# Patient Record
Sex: Male | Born: 1985
Health system: Southern US, Community
[De-identification: ages and names within clinical notes are randomized; demographics above are authoritative.]

---
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
## 2008-12-23 ENCOUNTER — Emergency Department (HOSPITAL_COMMUNITY): Admission: EM | Admit: 2008-12-23 | Discharge: 2008-12-23 | Payer: Self-pay | Admitting: Emergency Medicine

## 2014-11-17 DIAGNOSIS — H1010 Acute atopic conjunctivitis, unspecified eye: Secondary | ICD-10-CM | POA: Insufficient documentation

## 2014-11-17 DIAGNOSIS — J309 Allergic rhinitis, unspecified: Principal | ICD-10-CM

## 2014-12-11 ENCOUNTER — Ambulatory Visit (INDEPENDENT_AMBULATORY_CARE_PROVIDER_SITE_OTHER): Payer: 59

## 2014-12-11 DIAGNOSIS — H1010 Acute atopic conjunctivitis, unspecified eye: Secondary | ICD-10-CM

## 2014-12-11 DIAGNOSIS — J309 Allergic rhinitis, unspecified: Secondary | ICD-10-CM

## 2015-01-09 ENCOUNTER — Ambulatory Visit (INDEPENDENT_AMBULATORY_CARE_PROVIDER_SITE_OTHER): Payer: 59

## 2015-01-09 DIAGNOSIS — J309 Allergic rhinitis, unspecified: Secondary | ICD-10-CM | POA: Diagnosis not present

## 2015-02-03 ENCOUNTER — Ambulatory Visit (INDEPENDENT_AMBULATORY_CARE_PROVIDER_SITE_OTHER): Payer: 59 | Admitting: *Deleted

## 2015-02-03 DIAGNOSIS — J309 Allergic rhinitis, unspecified: Secondary | ICD-10-CM

## 2015-02-12 ENCOUNTER — Ambulatory Visit (INDEPENDENT_AMBULATORY_CARE_PROVIDER_SITE_OTHER): Payer: 59

## 2015-02-12 DIAGNOSIS — J309 Allergic rhinitis, unspecified: Secondary | ICD-10-CM | POA: Diagnosis not present

## 2015-02-24 ENCOUNTER — Ambulatory Visit (INDEPENDENT_AMBULATORY_CARE_PROVIDER_SITE_OTHER): Payer: 59

## 2015-02-24 DIAGNOSIS — J309 Allergic rhinitis, unspecified: Secondary | ICD-10-CM

## 2015-03-04 ENCOUNTER — Ambulatory Visit (INDEPENDENT_AMBULATORY_CARE_PROVIDER_SITE_OTHER): Payer: 59

## 2015-03-04 DIAGNOSIS — J309 Allergic rhinitis, unspecified: Secondary | ICD-10-CM | POA: Diagnosis not present

## 2015-03-12 ENCOUNTER — Ambulatory Visit (INDEPENDENT_AMBULATORY_CARE_PROVIDER_SITE_OTHER): Payer: 59

## 2015-03-12 DIAGNOSIS — J309 Allergic rhinitis, unspecified: Secondary | ICD-10-CM

## 2015-03-15 HISTORY — PX: KNEE SURGERY: SHX244

## 2015-04-06 ENCOUNTER — Ambulatory Visit (INDEPENDENT_AMBULATORY_CARE_PROVIDER_SITE_OTHER): Payer: 59 | Admitting: *Deleted

## 2015-04-06 DIAGNOSIS — J309 Allergic rhinitis, unspecified: Secondary | ICD-10-CM

## 2015-04-24 ENCOUNTER — Ambulatory Visit (INDEPENDENT_AMBULATORY_CARE_PROVIDER_SITE_OTHER): Payer: 59 | Admitting: Neurology

## 2015-04-24 DIAGNOSIS — J309 Allergic rhinitis, unspecified: Secondary | ICD-10-CM | POA: Diagnosis not present

## 2015-05-03 ENCOUNTER — Ambulatory Visit (INDEPENDENT_AMBULATORY_CARE_PROVIDER_SITE_OTHER): Payer: 59

## 2015-05-03 ENCOUNTER — Ambulatory Visit (INDEPENDENT_AMBULATORY_CARE_PROVIDER_SITE_OTHER): Payer: 59 | Admitting: Emergency Medicine

## 2015-05-03 VITALS — BP 112/72 | HR 73 | Temp 98.2°F | Resp 12 | Ht 77.0 in | Wt 330.0 lb

## 2015-05-03 DIAGNOSIS — M25561 Pain in right knee: Secondary | ICD-10-CM

## 2015-05-03 DIAGNOSIS — S83001A Unspecified subluxation of right patella, initial encounter: Secondary | ICD-10-CM

## 2015-05-03 DIAGNOSIS — S83003A Unspecified subluxation of unspecified patella, initial encounter: Secondary | ICD-10-CM | POA: Insufficient documentation

## 2015-05-03 MED ORDER — HYDROCODONE-ACETAMINOPHEN 5-325 MG PO TABS: 1.0000 | ORAL_TABLET | Freq: Four times a day (QID) | ORAL | Status: DC | PRN

## 2015-05-03 NOTE — Progress Notes (Addendum)
Chief Complaint:  Chief Complaint  Patient presents with  . Knee Injury    right knee dislocated today while sitting in church    HPI: Jason Hanson is a 30 y.o. male who is here for recurrent episodes of lsubuxation of the right knee . The kneecap has been slipping in and out today. It does not feel like it is completely back in at the present time. He is uncomfortable and has a burning pain down the right side of his leg.  No past medical history on file. No past surgical history on file. Social History   Social History  . Marital Status: Single    Spouse Name: N/A  . Number of Children: N/A  . Years of Education: N/A   Social History Main Topics  . Smoking status: Never Smoker   . Smokeless tobacco: None  . Alcohol Use: No  . Drug Use: No  . Sexual Activity: Not Asked   Other Topics Concern  . None   Social History Narrative  . None   No family history on file. Allergies  Allergen Reactions  . Sulfa Antibiotics Rash   Prior to Admission medications   Medication Sig Start Date End Date Taking? Authorizing Provider  cetirizine (ZYRTEC ALLERGY) 10 MG tablet Take 10 mg by mouth daily.   Yes Historical Provider, MD  lisinopril (PRINIVIL) 20 MG tablet Take 20 mg by mouth daily.   Yes Historical Provider, MD  montelukast (SINGULAIR) 10 MG tablet Take 10 mg by mouth daily.   Yes Historical Provider, MD  Multiple Vitamins-Minerals (MULTIVITAMIN ADULT) TABS Take 1 tablet by mouth daily.   Yes Historical Provider, MD  Azelastine-Fluticasone (DYMISTA) 137-50 MCG/ACT SUSP Place 2 sprays into the nose daily. Reported on 05/03/2015    Historical Provider, MD     ROS: The patient denies fevers, chills, night sweats, unintentional weight loss, chest pain, palpitations, wheezing, dyspnea on exertion, nausea, vomiting, abdominal pain, dysuria, hematuria, melena, numbness, weakness, or tingling.   All other systems have been reviewed and were otherwise negative with the  exception of those mentioned in the HPI and as above.    PHYSICAL EXAM: Filed Vitals:   05/03/15 1121  BP: 112/72  Pulse: 73  Temp: 98.2 F (36.8 C)  Resp: 12   Filed Vitals:   05/03/15 1121  Height:  (1.956 m)  Weight: 330 lb (149.687 kg)   Body mass index is 39.12 kg/(m^2).   General: Alert, no acute distress HEENT:  Normocephalic, atraumatic, oropharynx patent. EOMI, PERRLA Cardiovascular:  Regular rate and rhythm, no rubs murmurs or gallops.  No Carotid bruits, radial pulse intact. No pedal edema.  Respiratory: Clear to auscultation bilaterally.  No wheezes, rales, or rhonchi.  No cyanosis, no use of accessory musculature GI: No organomegaly, abdomen is soft and non-tender, positive bowel sounds.  No masses. Skin: No rashes. Neurologic: Facial musculature symmetric. Psychiatric: Patient is appropriate throughout our interaction. Lymphatic: No cervical lymphadenopathy Musculoskeletal: Difficult to walk. There is definite popping with flexion extension of the right knee. Pulses are 2+. Reflexes are 2+. There is no swelling about the knee.   LABS: No results found for this or any previous visit.   EKG/XRAY:   Primary read interpreted by Dr. Cleta Alberts at [facility].   ASSESSMENT/PLAN: 1. Knee pain, right  - DG Knee 1-2 Views Right; Future radiologist did not see a fracture.   2. Patellar subluxation, right, initial encounter   Referral made for patient to  see orthopedist. He was given a note to be out of work. 3. Numbness leg Patient may have also traumatized the peroneal nerve. He does have some numbness down the lateral portion of the right lower leg. Gross sideeffects, risk and benefits, and alternatives of medications d/w patient. Patient is aware that all medications have potential sideeffects and we are unable to predict every sideeffect or drug-drug interaction that may occur.  @ 05/03/2015 12:18 PM

## 2015-05-03 NOTE — Patient Instructions (Addendum)
Because you received an x-ray today, you will receive an invoice from [facility]. Please contact [facility] at [phone] with questions or concerns regarding your invoice. Our billing staff will not be able to assist you with those questions. Keep your leg elevated. Put pillows under your right knee. Apply ice to the area of tenderness around your right knee.

## 2015-05-04 ENCOUNTER — Telehealth: Payer: Self-pay | Admitting: Radiology

## 2015-05-04 NOTE — Addendum Note (Signed)
Addended by: Lesle Chris A on: 05/04/2015 08:45 AM   Modules accepted: Kipp Brood

## 2015-05-04 NOTE — Telephone Encounter (Signed)
At the patient request, I made a copy of his x-ray on CD, and placed in the pick up drawer.

## 2015-05-18 ENCOUNTER — Ambulatory Visit (INDEPENDENT_AMBULATORY_CARE_PROVIDER_SITE_OTHER): Payer: 59

## 2015-05-18 DIAGNOSIS — J309 Allergic rhinitis, unspecified: Secondary | ICD-10-CM | POA: Diagnosis not present

## 2015-06-04 DIAGNOSIS — J301 Allergic rhinitis due to pollen: Secondary | ICD-10-CM | POA: Diagnosis not present

## 2015-06-05 DIAGNOSIS — J3089 Other allergic rhinitis: Secondary | ICD-10-CM | POA: Diagnosis not present

## 2015-06-08 ENCOUNTER — Ambulatory Visit (INDEPENDENT_AMBULATORY_CARE_PROVIDER_SITE_OTHER): Payer: 59

## 2015-06-08 DIAGNOSIS — J309 Allergic rhinitis, unspecified: Secondary | ICD-10-CM | POA: Diagnosis not present

## 2015-07-02 ENCOUNTER — Ambulatory Visit (INDEPENDENT_AMBULATORY_CARE_PROVIDER_SITE_OTHER): Payer: 59

## 2015-07-02 DIAGNOSIS — J309 Allergic rhinitis, unspecified: Secondary | ICD-10-CM | POA: Diagnosis not present

## 2015-07-24 ENCOUNTER — Ambulatory Visit (INDEPENDENT_AMBULATORY_CARE_PROVIDER_SITE_OTHER): Payer: 59 | Admitting: *Deleted

## 2015-07-24 DIAGNOSIS — J309 Allergic rhinitis, unspecified: Secondary | ICD-10-CM

## 2015-07-29 ENCOUNTER — Other Ambulatory Visit: Payer: Self-pay

## 2015-07-29 ENCOUNTER — Ambulatory Visit (INDEPENDENT_AMBULATORY_CARE_PROVIDER_SITE_OTHER): Payer: 59

## 2015-07-29 DIAGNOSIS — J309 Allergic rhinitis, unspecified: Secondary | ICD-10-CM

## 2015-07-29 MED ORDER — MONTELUKAST SODIUM 10 MG PO TABS: 10.0000 mg | ORAL_TABLET | Freq: Every day | ORAL | Status: DC

## 2015-07-29 NOTE — Telephone Encounter (Signed)
Patient came by to make an appt because his montelukast was denied.  Pt wanted to see if he can get 1 refill until his appt on 08/12/15 with Dr. Lucie LeatherKozlow

## 2015-07-29 NOTE — Addendum Note (Signed)
Addended by: Clifton JamesLARK, HEATHER L on: 07/29/2015 02:27 PM   Modules accepted: Orders

## 2015-07-29 NOTE — Telephone Encounter (Signed)
Sent script into pharmacy. 

## 2015-08-04 ENCOUNTER — Ambulatory Visit (INDEPENDENT_AMBULATORY_CARE_PROVIDER_SITE_OTHER): Payer: 59

## 2015-08-04 DIAGNOSIS — J309 Allergic rhinitis, unspecified: Secondary | ICD-10-CM

## 2015-08-12 ENCOUNTER — Ambulatory Visit (INDEPENDENT_AMBULATORY_CARE_PROVIDER_SITE_OTHER): Payer: 59 | Admitting: Allergy and Immunology

## 2015-08-12 ENCOUNTER — Encounter: Payer: Self-pay | Admitting: Allergy and Immunology

## 2015-08-12 VITALS — BP 122/78 | HR 74 | Resp 18 | Ht 78.5 in | Wt 330.6 lb

## 2015-08-12 DIAGNOSIS — H1010 Acute atopic conjunctivitis, unspecified eye: Secondary | ICD-10-CM

## 2015-08-12 DIAGNOSIS — J309 Allergic rhinitis, unspecified: Secondary | ICD-10-CM

## 2015-08-12 NOTE — Patient Instructions (Addendum)
  1. Continue immunotherapy and EpiPen  2. Continue montelukast 10 mg daily  3. Continue Zyrtec if needed  4. Return to clinic in 1 year or earlier if problem 

## 2015-08-12 NOTE — Progress Notes (Signed)
Follow-up Note  Referring Provider: No ref. provider found Primary Provider: No PCP Per Patient Date of Office Visit: 08/12/2015  Subjective:   Jason Hanson (DOB: 1985-06-01) is a 30 y.o. male who [facility] in re-evaluation of the following:  HPI: Jason CowerJason returns to this clinic in reevaluation of his allergic rhinoconjunctivitis treated with immunotherapy. He has had an excellent response to immunotherapy and has basically eliminated almost all of his upper airway and eye issues while consistently using montelukast and Zyrtec. He has no need for using a nasal steroid at this point in time. He's had no adverse effects secondary to immunotherapy and he is presently using this therapy every 3 weeks.    Medication List           DYMISTA 137-50 MCG/ACT Susp  Generic drug:  Azelastine-Fluticasone  Place 2 sprays into the nose daily. Reported on 08/12/2015     FLAX SEED OIL PO  Take 1 tablet by mouth daily.     HYDROcodone-acetaminophen 5-325 MG tablet  Commonly known as:  NORCO  Take 1 tablet by mouth every 6 (six) hours as needed for moderate pain.     Magnesium 250 MG Tabs  Take 1 tablet by mouth daily.     montelukast 10 MG tablet  Commonly known as:  SINGULAIR  Take 1 tablet (10 mg total) by mouth daily.     MULTIVITAMIN ADULT Tabs  Take 1 tablet by mouth daily.     PRINIVIL 20 MG tablet  Generic drug:  lisinopril  Take 20 mg by mouth daily.     ZYRTEC ALLERGY 10 MG tablet  Generic drug:  cetirizine  Take 10 mg by mouth daily.        History reviewed. No pertinent past medical history.  Past Surgical History  Procedure Laterality Date  . Knee surgery  2017    Allergies  Allergen Reactions  . Sulfa Antibiotics Rash    Review of systems negative except as noted in HPI / PMHx or noted below:  Review of Systems  Constitutional: Negative.   HENT: Negative.   Eyes: Negative.   Respiratory: Negative.     Cardiovascular: Negative.   Gastrointestinal: Negative.   Genitourinary: Negative.   Musculoskeletal: Negative.   Skin: Negative.   Neurological: Negative.   Endo/Heme/Allergies: Negative.   Psychiatric/Behavioral: Negative.      Objective:   Filed Vitals:   08/12/15 1049  BP: 122/78  Pulse: 74  Resp: 18   Height: 6' 6.5" (199.4 cm)  Weight: (!) 330 lb 9.6 oz (149.959 kg)   Physical Exam  Constitutional: He is well-developed, well-nourished, and in no distress.  HENT:  Head: Normocephalic.  Right Ear: External ear and ear canal normal. Tympanic membrane is scarred.  Left Ear: External ear and ear canal normal. Tympanic membrane is scarred.  Nose: Nose normal. No mucosal edema or rhinorrhea.  Mouth/Throat: Uvula is midline, oropharynx is clear and moist and mucous membranes are normal. No oropharyngeal exudate.  Eyes: Conjunctivae are normal.  Neck: Trachea normal. No tracheal tenderness present. No tracheal deviation present. No thyromegaly present.  Cardiovascular: Normal rate, regular rhythm, S1 normal, S2 normal and normal heart sounds.   No murmur heard. Pulmonary/Chest: Breath sounds normal. No stridor. No respiratory distress. He has no wheezes. He has no rales.  Musculoskeletal: He exhibits no edema.  Lymphadenopathy:       Head (right side): No tonsillar adenopathy present.  Head (left side): No tonsillar adenopathy present.    He has no cervical adenopathy.  Neurological: He is alert. Gait normal.  Skin: No rash noted. He is not diaphoretic. No erythema. Nails show no clubbing.  Psychiatric: Mood and affect normal.    Diagnostics: None      Assessment and Plan:   1. Allergic rhinoconjunctivitis     1. Continue immunotherapy and EpiPen  2. Continue montelukast 10 mg daily  3. Continue Zyrtec if needed  4. Return to clinic in 1 year or earlier if problem  Jason Hanson is doing wonderful on his current immunotherapy and we will continue to have him  use this form of treatment along with montelukast and Zyrtec and I will see him back in this clinic in approximately one year or earlier if there is a problem.  Laurette Schimke, MD [facility]

## 2015-08-21 ENCOUNTER — Ambulatory Visit (INDEPENDENT_AMBULATORY_CARE_PROVIDER_SITE_OTHER): Payer: 59 | Admitting: *Deleted

## 2015-08-21 DIAGNOSIS — J309 Allergic rhinitis, unspecified: Secondary | ICD-10-CM

## 2015-08-28 ENCOUNTER — Ambulatory Visit (INDEPENDENT_AMBULATORY_CARE_PROVIDER_SITE_OTHER): Payer: 59 | Admitting: *Deleted

## 2015-08-28 DIAGNOSIS — J309 Allergic rhinitis, unspecified: Secondary | ICD-10-CM | POA: Diagnosis not present

## 2015-09-05 ENCOUNTER — Other Ambulatory Visit: Payer: Self-pay | Admitting: Allergy and Immunology

## 2015-09-22 ENCOUNTER — Ambulatory Visit (INDEPENDENT_AMBULATORY_CARE_PROVIDER_SITE_OTHER): Payer: 59

## 2015-09-22 DIAGNOSIS — J309 Allergic rhinitis, unspecified: Secondary | ICD-10-CM | POA: Diagnosis not present

## 2015-10-15 ENCOUNTER — Ambulatory Visit (INDEPENDENT_AMBULATORY_CARE_PROVIDER_SITE_OTHER): Payer: 59

## 2015-10-15 DIAGNOSIS — J309 Allergic rhinitis, unspecified: Secondary | ICD-10-CM | POA: Diagnosis not present

## 2015-11-03 ENCOUNTER — Ambulatory Visit (INDEPENDENT_AMBULATORY_CARE_PROVIDER_SITE_OTHER): Payer: 59

## 2015-11-03 DIAGNOSIS — J309 Allergic rhinitis, unspecified: Secondary | ICD-10-CM | POA: Diagnosis not present

## 2015-11-04 DIAGNOSIS — J301 Allergic rhinitis due to pollen: Secondary | ICD-10-CM | POA: Diagnosis not present

## 2015-11-05 DIAGNOSIS — J3089 Other allergic rhinitis: Secondary | ICD-10-CM | POA: Diagnosis not present

## 2015-11-26 ENCOUNTER — Ambulatory Visit (INDEPENDENT_AMBULATORY_CARE_PROVIDER_SITE_OTHER): Payer: 59 | Admitting: *Deleted

## 2015-11-26 DIAGNOSIS — J309 Allergic rhinitis, unspecified: Secondary | ICD-10-CM | POA: Diagnosis not present

## 2015-12-18 ENCOUNTER — Ambulatory Visit (INDEPENDENT_AMBULATORY_CARE_PROVIDER_SITE_OTHER): Payer: 59 | Admitting: *Deleted

## 2015-12-18 DIAGNOSIS — J309 Allergic rhinitis, unspecified: Secondary | ICD-10-CM | POA: Diagnosis not present

## 2015-12-18 DIAGNOSIS — H1010 Acute atopic conjunctivitis, unspecified eye: Secondary | ICD-10-CM

## 2016-01-07 ENCOUNTER — Ambulatory Visit (INDEPENDENT_AMBULATORY_CARE_PROVIDER_SITE_OTHER): Payer: 59 | Admitting: *Deleted

## 2016-01-07 DIAGNOSIS — J309 Allergic rhinitis, unspecified: Secondary | ICD-10-CM | POA: Diagnosis not present

## 2016-01-07 DIAGNOSIS — H1010 Acute atopic conjunctivitis, unspecified eye: Secondary | ICD-10-CM

## 2016-01-15 ENCOUNTER — Ambulatory Visit (INDEPENDENT_AMBULATORY_CARE_PROVIDER_SITE_OTHER): Payer: 59

## 2016-01-15 DIAGNOSIS — H1010 Acute atopic conjunctivitis, unspecified eye: Secondary | ICD-10-CM

## 2016-01-15 DIAGNOSIS — J309 Allergic rhinitis, unspecified: Secondary | ICD-10-CM

## 2016-01-21 ENCOUNTER — Ambulatory Visit (INDEPENDENT_AMBULATORY_CARE_PROVIDER_SITE_OTHER): Payer: 59 | Admitting: *Deleted

## 2016-01-21 DIAGNOSIS — H1010 Acute atopic conjunctivitis, unspecified eye: Secondary | ICD-10-CM | POA: Diagnosis not present

## 2016-01-21 DIAGNOSIS — J309 Allergic rhinitis, unspecified: Secondary | ICD-10-CM | POA: Diagnosis not present

## 2016-02-02 ENCOUNTER — Ambulatory Visit (INDEPENDENT_AMBULATORY_CARE_PROVIDER_SITE_OTHER): Payer: 59 | Admitting: *Deleted

## 2016-02-02 DIAGNOSIS — H1010 Acute atopic conjunctivitis, unspecified eye: Secondary | ICD-10-CM | POA: Diagnosis not present

## 2016-02-02 DIAGNOSIS — J309 Allergic rhinitis, unspecified: Secondary | ICD-10-CM | POA: Diagnosis not present

## 2016-02-11 ENCOUNTER — Ambulatory Visit (INDEPENDENT_AMBULATORY_CARE_PROVIDER_SITE_OTHER): Payer: 59

## 2016-02-11 DIAGNOSIS — H1010 Acute atopic conjunctivitis, unspecified eye: Secondary | ICD-10-CM | POA: Diagnosis not present

## 2016-02-11 DIAGNOSIS — J309 Allergic rhinitis, unspecified: Secondary | ICD-10-CM

## 2016-02-25 ENCOUNTER — Ambulatory Visit (INDEPENDENT_AMBULATORY_CARE_PROVIDER_SITE_OTHER): Payer: 59

## 2016-02-25 DIAGNOSIS — H1010 Acute atopic conjunctivitis, unspecified eye: Secondary | ICD-10-CM | POA: Diagnosis not present

## 2016-02-25 DIAGNOSIS — J309 Allergic rhinitis, unspecified: Secondary | ICD-10-CM | POA: Diagnosis not present

## 2016-03-24 ENCOUNTER — Ambulatory Visit (INDEPENDENT_AMBULATORY_CARE_PROVIDER_SITE_OTHER): Payer: 59

## 2016-03-24 DIAGNOSIS — H1010 Acute atopic conjunctivitis, unspecified eye: Secondary | ICD-10-CM

## 2016-03-24 DIAGNOSIS — J309 Allergic rhinitis, unspecified: Secondary | ICD-10-CM | POA: Diagnosis not present

## 2016-03-29 NOTE — Addendum Note (Signed)
Addended by: Berna BueWHITAKER, Elleanna Melling L on: 03/29/2016 10:06 AM   Modules accepted: Orders

## 2016-04-07 ENCOUNTER — Ambulatory Visit (INDEPENDENT_AMBULATORY_CARE_PROVIDER_SITE_OTHER): Payer: 59

## 2016-04-07 DIAGNOSIS — H1010 Acute atopic conjunctivitis, unspecified eye: Secondary | ICD-10-CM | POA: Diagnosis not present

## 2016-04-07 DIAGNOSIS — J309 Allergic rhinitis, unspecified: Secondary | ICD-10-CM

## 2016-04-20 DIAGNOSIS — J3089 Other allergic rhinitis: Secondary | ICD-10-CM | POA: Diagnosis not present

## 2016-04-21 DIAGNOSIS — J301 Allergic rhinitis due to pollen: Secondary | ICD-10-CM | POA: Diagnosis not present

## 2016-04-28 ENCOUNTER — Ambulatory Visit (INDEPENDENT_AMBULATORY_CARE_PROVIDER_SITE_OTHER): Payer: 59

## 2016-04-28 DIAGNOSIS — J309 Allergic rhinitis, unspecified: Secondary | ICD-10-CM

## 2016-05-19 ENCOUNTER — Ambulatory Visit (INDEPENDENT_AMBULATORY_CARE_PROVIDER_SITE_OTHER): Payer: 59 | Admitting: *Deleted

## 2016-05-19 DIAGNOSIS — J309 Allergic rhinitis, unspecified: Secondary | ICD-10-CM | POA: Diagnosis not present

## 2016-06-13 ENCOUNTER — Ambulatory Visit (INDEPENDENT_AMBULATORY_CARE_PROVIDER_SITE_OTHER): Payer: 59 | Admitting: *Deleted

## 2016-06-13 DIAGNOSIS — J309 Allergic rhinitis, unspecified: Secondary | ICD-10-CM | POA: Diagnosis not present

## 2016-07-11 ENCOUNTER — Ambulatory Visit (INDEPENDENT_AMBULATORY_CARE_PROVIDER_SITE_OTHER): Payer: 59 | Admitting: *Deleted

## 2016-07-11 DIAGNOSIS — J309 Allergic rhinitis, unspecified: Secondary | ICD-10-CM | POA: Diagnosis not present

## 2016-07-22 ENCOUNTER — Ambulatory Visit (INDEPENDENT_AMBULATORY_CARE_PROVIDER_SITE_OTHER): Payer: 59

## 2016-07-22 DIAGNOSIS — J309 Allergic rhinitis, unspecified: Secondary | ICD-10-CM

## 2016-07-28 ENCOUNTER — Ambulatory Visit (INDEPENDENT_AMBULATORY_CARE_PROVIDER_SITE_OTHER): Payer: 59

## 2016-07-28 DIAGNOSIS — J309 Allergic rhinitis, unspecified: Secondary | ICD-10-CM

## 2016-08-04 ENCOUNTER — Ambulatory Visit (INDEPENDENT_AMBULATORY_CARE_PROVIDER_SITE_OTHER): Payer: 59 | Admitting: *Deleted

## 2016-08-04 DIAGNOSIS — J309 Allergic rhinitis, unspecified: Secondary | ICD-10-CM

## 2016-08-12 ENCOUNTER — Ambulatory Visit (INDEPENDENT_AMBULATORY_CARE_PROVIDER_SITE_OTHER): Payer: 59

## 2016-08-12 DIAGNOSIS — J309 Allergic rhinitis, unspecified: Secondary | ICD-10-CM | POA: Diagnosis not present

## 2016-09-06 ENCOUNTER — Ambulatory Visit (INDEPENDENT_AMBULATORY_CARE_PROVIDER_SITE_OTHER): Payer: 59 | Admitting: *Deleted

## 2016-09-06 DIAGNOSIS — J309 Allergic rhinitis, unspecified: Secondary | ICD-10-CM

## 2016-10-04 ENCOUNTER — Ambulatory Visit (INDEPENDENT_AMBULATORY_CARE_PROVIDER_SITE_OTHER): Payer: 59 | Admitting: *Deleted

## 2016-10-04 DIAGNOSIS — J309 Allergic rhinitis, unspecified: Secondary | ICD-10-CM

## 2016-10-06 ENCOUNTER — Other Ambulatory Visit: Payer: Self-pay | Admitting: Allergy and Immunology

## 2016-10-12 DIAGNOSIS — J301 Allergic rhinitis due to pollen: Secondary | ICD-10-CM

## 2016-10-28 ENCOUNTER — Ambulatory Visit (INDEPENDENT_AMBULATORY_CARE_PROVIDER_SITE_OTHER): Payer: 59

## 2016-10-28 DIAGNOSIS — J309 Allergic rhinitis, unspecified: Secondary | ICD-10-CM

## 2016-11-22 ENCOUNTER — Ambulatory Visit (INDEPENDENT_AMBULATORY_CARE_PROVIDER_SITE_OTHER): Payer: 59 | Admitting: Allergy and Immunology

## 2016-11-22 ENCOUNTER — Encounter: Payer: Self-pay | Admitting: Allergy and Immunology

## 2016-11-22 VITALS — BP 126/78 | HR 76 | Resp 16

## 2016-11-22 DIAGNOSIS — J3089 Other allergic rhinitis: Secondary | ICD-10-CM

## 2016-11-22 MED ORDER — MONTELUKAST SODIUM 10 MG PO TABS: 10.0000 mg | ORAL_TABLET | Freq: Every day | ORAL | 11 refills | Status: DC

## 2016-11-22 NOTE — Patient Instructions (Signed)
  1. Continue immunotherapy and EpiPen  2. Continue montelukast 10 mg daily  3. Continue Zyrtec if needed  4. Return to clinic in 1 year or earlier if problem

## 2016-11-22 NOTE — Progress Notes (Signed)
Follow-up Note  Referring Provider: No ref. provider found Primary Provider: Patient, No Pcp Per Date of Office Visit: 11/22/2016  Subjective:   Jason Hanson (DOB: [birth year]) is a 31 y.o. male who [facility] 11[facility]22/2016 in re-evaluation of the following:  HPI: Jason CowerJason returns to this clinic in reevaluation of his allergic rhinoconjunctivitis treated with immunotherapy. His last visit to this clinic was May 2017.  He has really done quite well with his immunotherapy. Presently he is using this form of therapy every 3 weeks. He went through the spring doing excellent while using montelukast on a regular basis and occasionally an antihistamine. It does not sound as though he has required a systemic steroid or antibiotic to treat any type of respiratory tract issue.  Allergies as of 11/22/2016      Reactions   Sulfa Antibiotics Rash      Medication List      FLAX SEED OIL PO Take 1 tablet by mouth daily.   Magnesium 250 MG Tabs Take 1 tablet by mouth daily.   montelukast 10 MG tablet Commonly known as:  SINGULAIR TAKE 1 TABLET(10 MG) BY MOUTH DAILY   MULTIVITAMIN ADULT Tabs Take 1 tablet by mouth daily.   PRINIVIL 20 MG tablet Generic drug:  lisinopril Take 20 mg by mouth daily.   ZYRTEC ALLERGY 10 MG tablet Generic drug:  cetirizine Take 10 mg by mouth daily.       No past medical history on file.  Past Surgical History:  Procedure Laterality Date  . KNEE SURGERY  2017    Review of systems negative except as noted in HPI / PMHx or noted below:  Review of Systems  Constitutional: Negative.   HENT: Negative.   Eyes: Negative.   Respiratory: Negative.   Cardiovascular: Negative.   Gastrointestinal: Negative.   Genitourinary: Negative.   Musculoskeletal: Negative.   Skin: Negative.   Neurological: Negative.   Endo/Heme/Allergies: Negative.   Psychiatric/Behavioral: Negative.      Objective:   Vitals:   11/22/16  1551  BP: 126/78  Pulse: 76  Resp: 16          Physical Exam  Constitutional: He is well-developed, well-nourished, and in no distress.  HENT:  Head: Normocephalic.  Right Ear: External ear and ear canal normal. Tympanic membrane is scarred.  Left Ear: External ear and ear canal normal. Tympanic membrane is scarred.  Nose: Nose normal. No mucosal edema or rhinorrhea.  Mouth/Throat: Uvula is midline, oropharynx is clear and moist and mucous membranes are normal. No oropharyngeal exudate.  Eyes: Conjunctivae are normal.  Neck: Trachea normal. No tracheal tenderness present. No tracheal deviation present. No thyromegaly present.  Cardiovascular: Normal rate, regular rhythm, S1 normal, S2 normal and normal heart sounds.   No murmur heard. Pulmonary/Chest: Breath sounds normal. No stridor. No respiratory distress. He has no wheezes. He has no rales.  Musculoskeletal: He exhibits no edema.  Lymphadenopathy:       Head (right side): No tonsillar adenopathy present.       Head (left side): No tonsillar adenopathy present.    He has no cervical adenopathy.  Neurological: He is alert. Gait normal.  Skin: No rash noted. He is not diaphoretic. No erythema. Nails show no clubbing.  Psychiatric: Mood and affect normal.    Diagnostics: none  Assessment and Plan:   1. Other allergic rhinitis     1. Continue immunotherapy and EpiPen  2. Continue montelukast  10 mg daily  3. Continue Zyrtec if needed  4. Return to clinic in 1 year or earlier if problem  Jason Hanson appears to be doing quite well on his current plan and he will continue on immunotherapy and use a leukotriene modifier and I will see him back in this clinic in 1 year. He has not seen an ENT doctor in many years for his ears. He has had placement of ear ventilation tubes on multiple occasions and surgery on both ears to repair perforation and it may be best for him to have a quick view by a experienced ENT doctor to make sure he is  not developing some other issue in his ear such as a cholesteatoma. He will contact Dr. Dorma Russell who is seen in the past.  Laurette Schimke, MD Allergy / Immunology [facility]

## 2016-12-27 ENCOUNTER — Ambulatory Visit (INDEPENDENT_AMBULATORY_CARE_PROVIDER_SITE_OTHER): Payer: 59 | Admitting: *Deleted

## 2016-12-27 DIAGNOSIS — J309 Allergic rhinitis, unspecified: Secondary | ICD-10-CM

## 2017-01-16 ENCOUNTER — Ambulatory Visit (INDEPENDENT_AMBULATORY_CARE_PROVIDER_SITE_OTHER): Payer: 59 | Admitting: *Deleted

## 2017-01-16 DIAGNOSIS — J309 Allergic rhinitis, unspecified: Secondary | ICD-10-CM

## 2017-02-07 ENCOUNTER — Ambulatory Visit (INDEPENDENT_AMBULATORY_CARE_PROVIDER_SITE_OTHER): Payer: 59 | Admitting: *Deleted

## 2017-02-07 DIAGNOSIS — J309 Allergic rhinitis, unspecified: Secondary | ICD-10-CM | POA: Diagnosis not present

## 2017-02-17 ENCOUNTER — Ambulatory Visit (INDEPENDENT_AMBULATORY_CARE_PROVIDER_SITE_OTHER): Payer: 59

## 2017-02-17 DIAGNOSIS — J309 Allergic rhinitis, unspecified: Secondary | ICD-10-CM

## 2017-02-27 ENCOUNTER — Ambulatory Visit (INDEPENDENT_AMBULATORY_CARE_PROVIDER_SITE_OTHER): Payer: 59 | Admitting: Allergy & Immunology

## 2017-02-27 DIAGNOSIS — J309 Allergic rhinitis, unspecified: Secondary | ICD-10-CM

## 2017-03-09 ENCOUNTER — Ambulatory Visit (INDEPENDENT_AMBULATORY_CARE_PROVIDER_SITE_OTHER): Payer: 59

## 2017-03-09 DIAGNOSIS — J309 Allergic rhinitis, unspecified: Secondary | ICD-10-CM

## 2017-03-15 ENCOUNTER — Ambulatory Visit (INDEPENDENT_AMBULATORY_CARE_PROVIDER_SITE_OTHER): Payer: 59 | Admitting: *Deleted

## 2017-03-15 DIAGNOSIS — J309 Allergic rhinitis, unspecified: Secondary | ICD-10-CM

## 2017-04-25 ENCOUNTER — Ambulatory Visit (INDEPENDENT_AMBULATORY_CARE_PROVIDER_SITE_OTHER): Payer: 59 | Admitting: *Deleted

## 2017-04-25 DIAGNOSIS — J309 Allergic rhinitis, unspecified: Secondary | ICD-10-CM | POA: Diagnosis not present

## 2017-05-24 IMAGING — CR DG KNEE 1-2V*R*
3 series · 3 of 3 positions shown · non-contrast
Comparison: None.

CLINICAL DATA: Right knee pain.  No reported injury.

EXAM:
RIGHT KNEE - 1-2 VIEW

[AP]
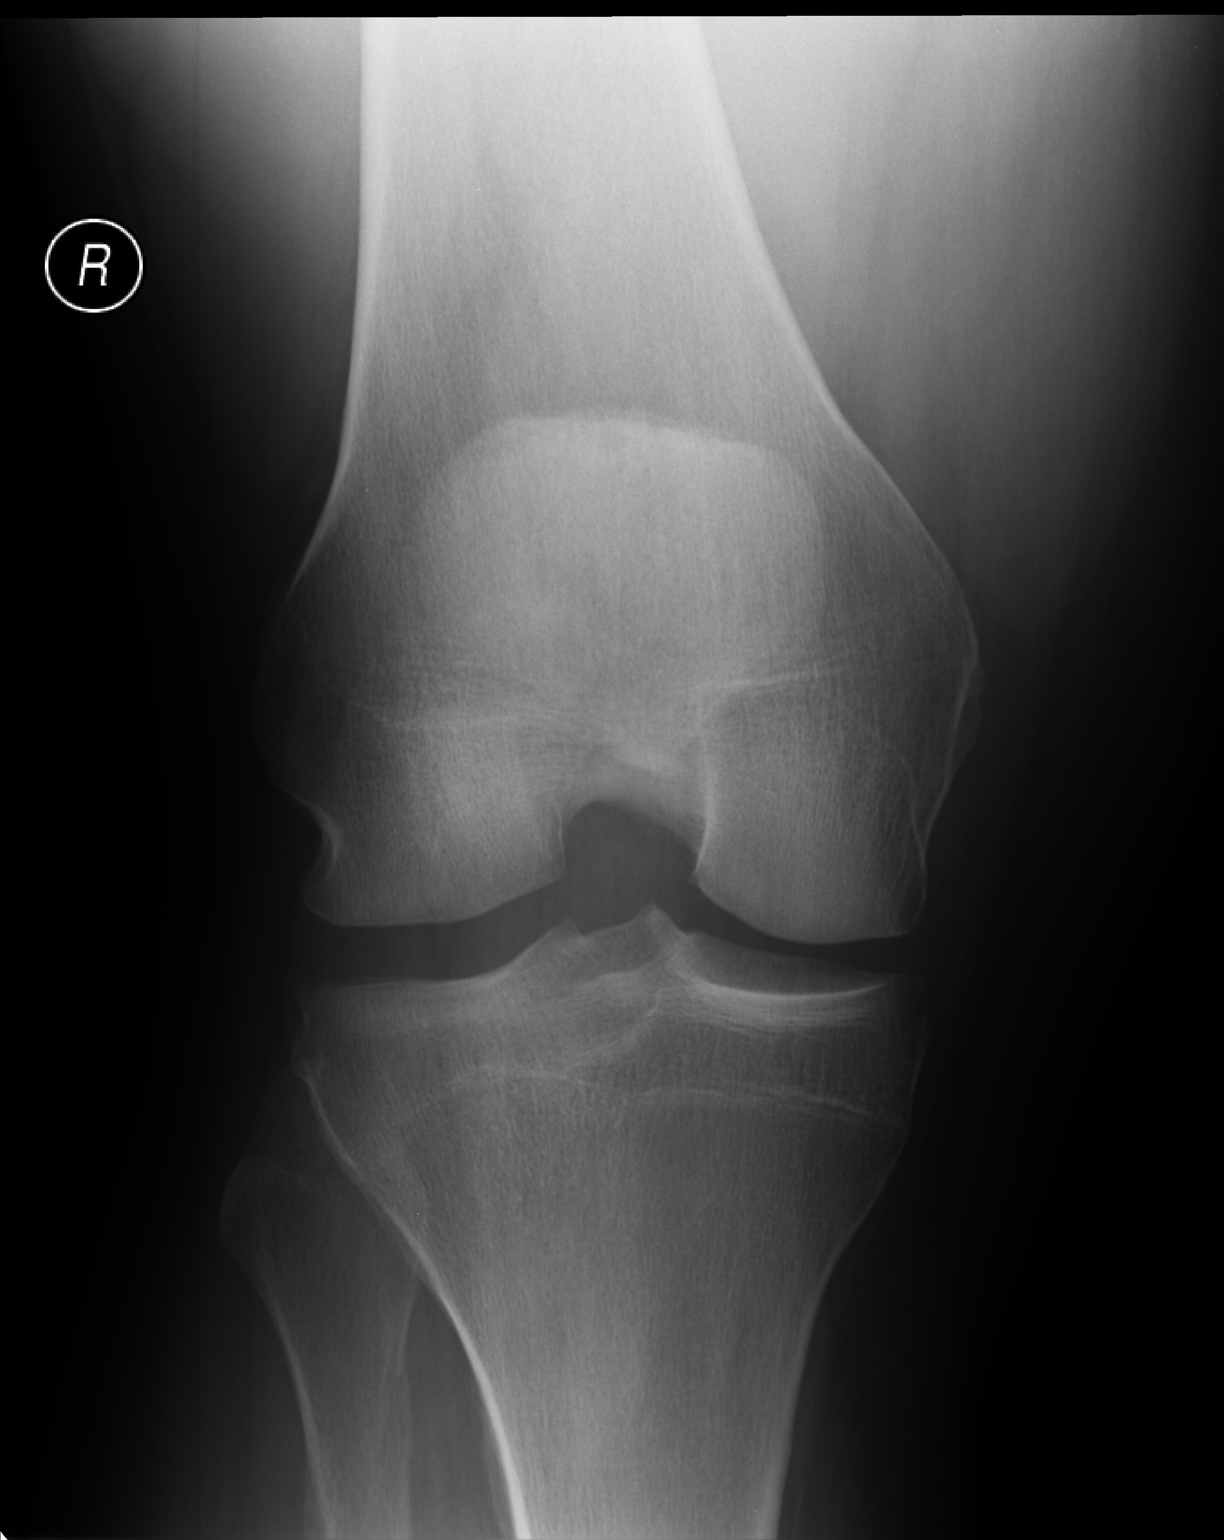

[lateral (1 of 2)]
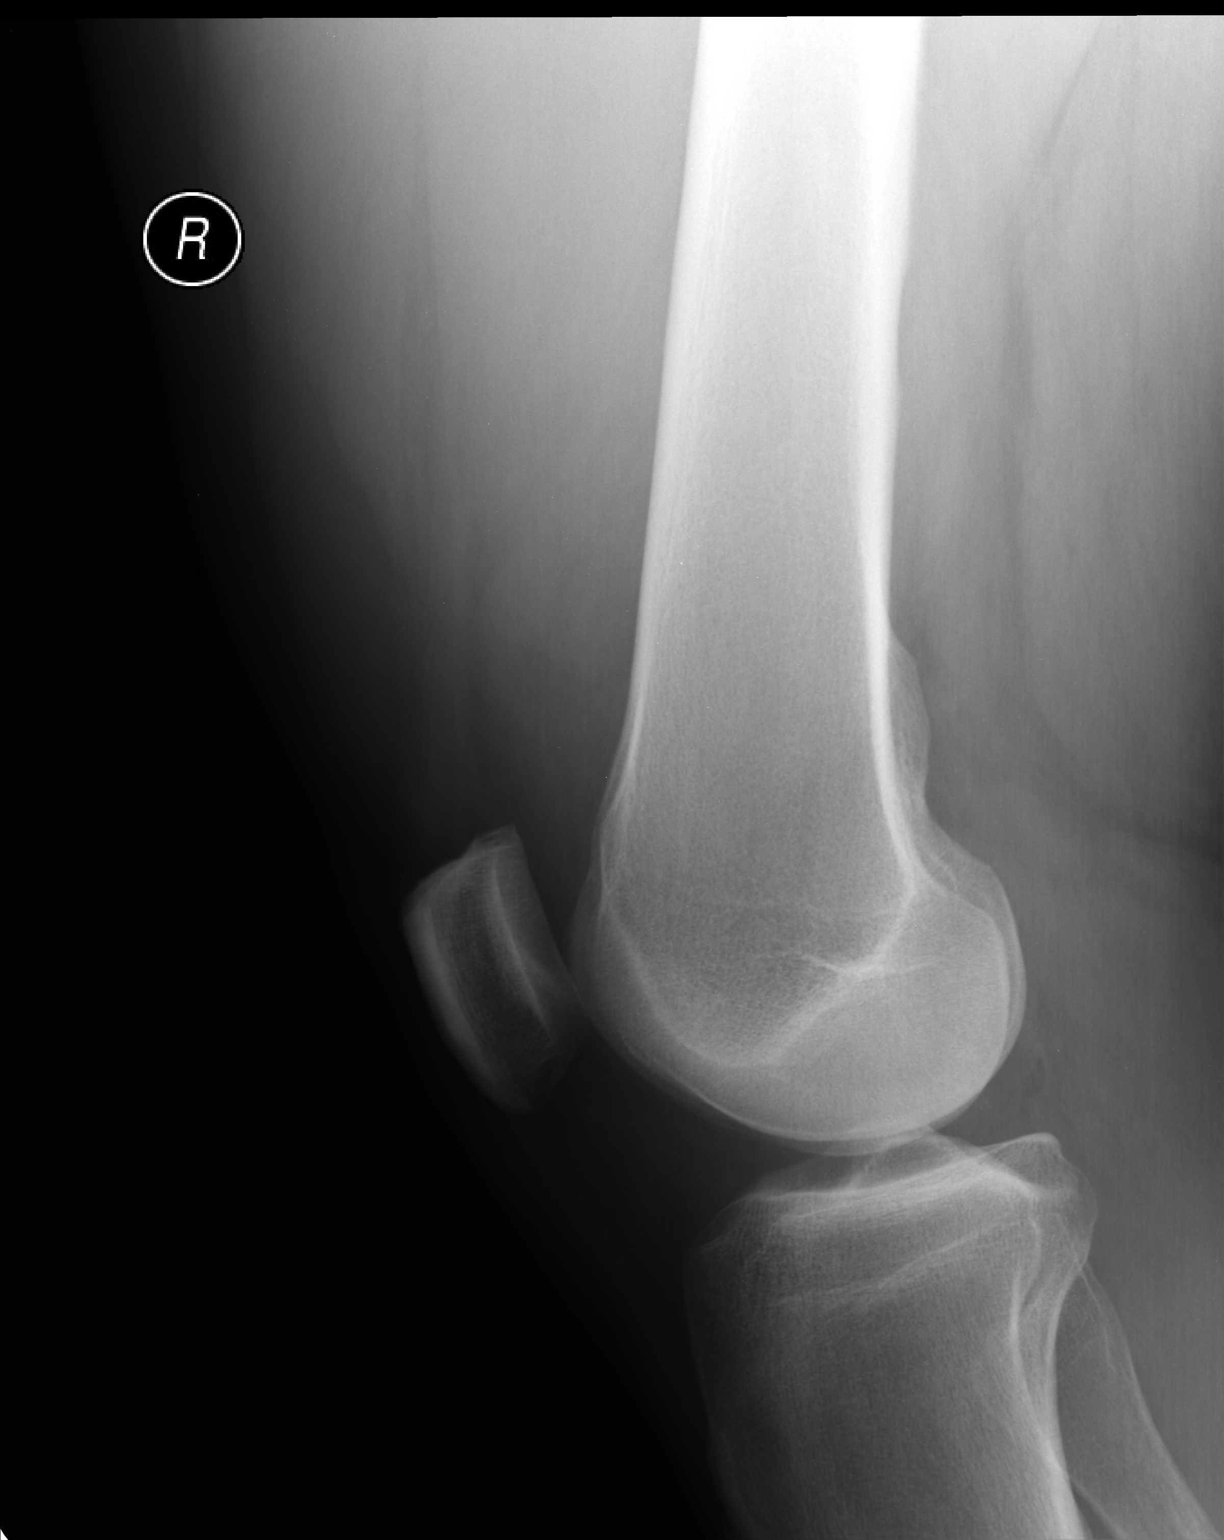

[lateral (2 of 2)]
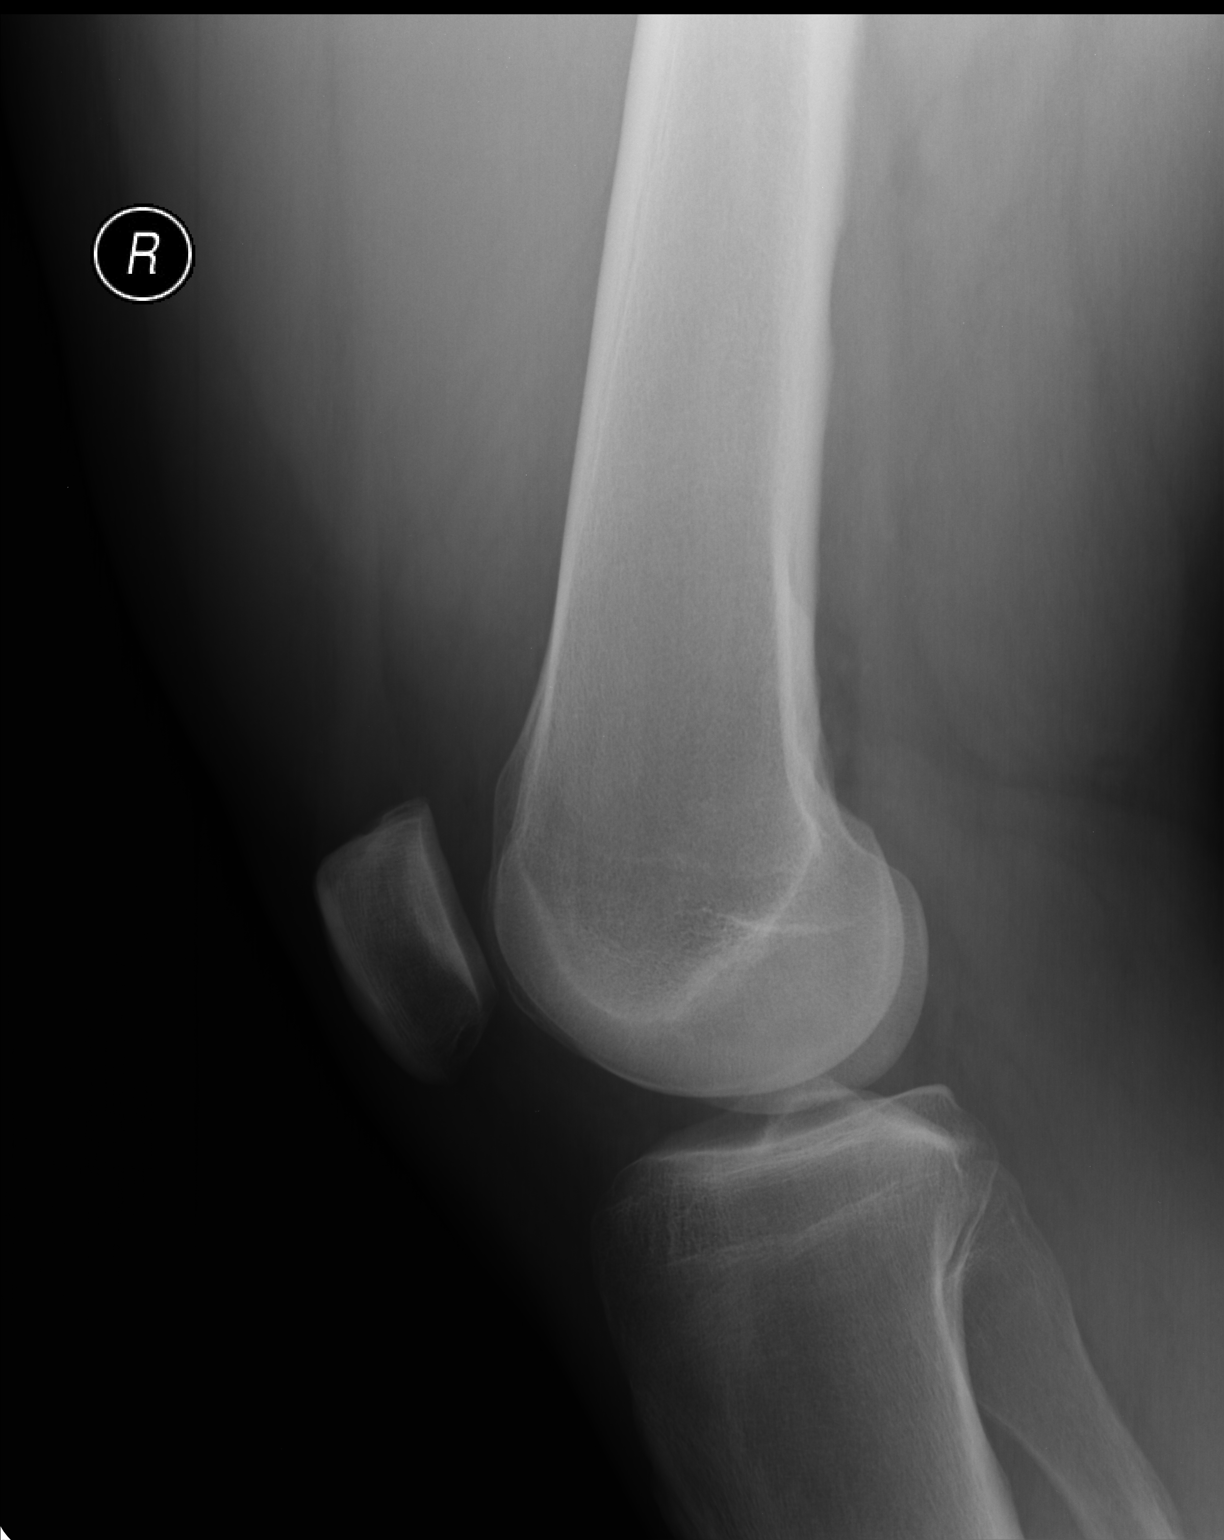

[3 of 3 positions shown; findings below may reference images not displayed]

FINDINGS: There is no evidence of fracture, dislocation, or joint effusion.
There is no evidence of arthropathy or other focal bone abnormality.
Soft tissues are unremarkable.
IMPRESSION: Negative.

## 2017-05-25 ENCOUNTER — Ambulatory Visit (INDEPENDENT_AMBULATORY_CARE_PROVIDER_SITE_OTHER): Payer: 59 | Admitting: *Deleted

## 2017-05-25 DIAGNOSIS — J309 Allergic rhinitis, unspecified: Secondary | ICD-10-CM

## 2017-06-22 ENCOUNTER — Ambulatory Visit (INDEPENDENT_AMBULATORY_CARE_PROVIDER_SITE_OTHER): Payer: 59 | Admitting: *Deleted

## 2017-06-22 DIAGNOSIS — J309 Allergic rhinitis, unspecified: Secondary | ICD-10-CM

## 2017-06-28 ENCOUNTER — Encounter: Payer: Self-pay | Admitting: *Deleted

## 2017-06-28 NOTE — Progress Notes (Signed)
Maintenance vial made. Exp: 06-29-18. hv 

## 2017-06-30 DIAGNOSIS — J301 Allergic rhinitis due to pollen: Secondary | ICD-10-CM | POA: Diagnosis not present

## 2017-07-25 ENCOUNTER — Ambulatory Visit (INDEPENDENT_AMBULATORY_CARE_PROVIDER_SITE_OTHER): Payer: 59 | Admitting: *Deleted

## 2017-07-25 DIAGNOSIS — J309 Allergic rhinitis, unspecified: Secondary | ICD-10-CM

## 2017-08-17 ENCOUNTER — Ambulatory Visit (INDEPENDENT_AMBULATORY_CARE_PROVIDER_SITE_OTHER): Payer: 59

## 2017-08-17 DIAGNOSIS — J309 Allergic rhinitis, unspecified: Secondary | ICD-10-CM | POA: Diagnosis not present

## 2017-08-24 ENCOUNTER — Ambulatory Visit (INDEPENDENT_AMBULATORY_CARE_PROVIDER_SITE_OTHER): Payer: 59

## 2017-08-24 DIAGNOSIS — J309 Allergic rhinitis, unspecified: Secondary | ICD-10-CM

## 2017-08-31 ENCOUNTER — Ambulatory Visit (INDEPENDENT_AMBULATORY_CARE_PROVIDER_SITE_OTHER): Payer: 59 | Admitting: *Deleted

## 2017-08-31 DIAGNOSIS — J309 Allergic rhinitis, unspecified: Secondary | ICD-10-CM | POA: Diagnosis not present

## 2017-09-11 ENCOUNTER — Ambulatory Visit (INDEPENDENT_AMBULATORY_CARE_PROVIDER_SITE_OTHER): Payer: 59 | Admitting: *Deleted

## 2017-09-11 DIAGNOSIS — J309 Allergic rhinitis, unspecified: Secondary | ICD-10-CM

## 2017-09-22 ENCOUNTER — Ambulatory Visit (INDEPENDENT_AMBULATORY_CARE_PROVIDER_SITE_OTHER): Payer: 59

## 2017-09-22 DIAGNOSIS — J309 Allergic rhinitis, unspecified: Secondary | ICD-10-CM

## 2017-09-28 ENCOUNTER — Ambulatory Visit (INDEPENDENT_AMBULATORY_CARE_PROVIDER_SITE_OTHER): Payer: 59 | Admitting: *Deleted

## 2017-09-28 DIAGNOSIS — J309 Allergic rhinitis, unspecified: Secondary | ICD-10-CM | POA: Diagnosis not present

## 2017-11-10 ENCOUNTER — Ambulatory Visit (INDEPENDENT_AMBULATORY_CARE_PROVIDER_SITE_OTHER): Payer: 59

## 2017-11-10 DIAGNOSIS — J309 Allergic rhinitis, unspecified: Secondary | ICD-10-CM

## 2017-12-05 ENCOUNTER — Ambulatory Visit (INDEPENDENT_AMBULATORY_CARE_PROVIDER_SITE_OTHER): Payer: 59 | Admitting: *Deleted

## 2017-12-05 DIAGNOSIS — J309 Allergic rhinitis, unspecified: Secondary | ICD-10-CM | POA: Diagnosis not present

## 2017-12-09 ENCOUNTER — Other Ambulatory Visit: Payer: Self-pay | Admitting: Allergy and Immunology

## 2018-01-15 ENCOUNTER — Ambulatory Visit (INDEPENDENT_AMBULATORY_CARE_PROVIDER_SITE_OTHER): Payer: 59 | Admitting: *Deleted

## 2018-01-15 ENCOUNTER — Other Ambulatory Visit: Payer: Self-pay | Admitting: Allergy and Immunology

## 2018-01-15 DIAGNOSIS — J309 Allergic rhinitis, unspecified: Secondary | ICD-10-CM | POA: Diagnosis not present

## 2018-02-22 ENCOUNTER — Ambulatory Visit (INDEPENDENT_AMBULATORY_CARE_PROVIDER_SITE_OTHER): Payer: 59 | Admitting: *Deleted

## 2018-02-22 DIAGNOSIS — J309 Allergic rhinitis, unspecified: Secondary | ICD-10-CM
